# Patient Record
Sex: Female | Born: 2000 | Hispanic: Yes | Marital: Single | Smoking: Never smoker
Health system: Southern US, Community
[De-identification: ages and names within clinical notes are randomized; demographics above are authoritative.]

## PROBLEM LIST (undated history)

## (undated) DIAGNOSIS — J45909 Unspecified asthma, uncomplicated: Secondary | ICD-10-CM

## (undated) DIAGNOSIS — C73 Malignant neoplasm of thyroid gland: Secondary | ICD-10-CM

## (undated) DIAGNOSIS — J309 Allergic rhinitis, unspecified: Secondary | ICD-10-CM

## (undated) DIAGNOSIS — U071 COVID-19: Secondary | ICD-10-CM

## (undated) DIAGNOSIS — E039 Hypothyroidism, unspecified: Secondary | ICD-10-CM

## (undated) HISTORY — DX: Allergic rhinitis, unspecified: J30.9

## (undated) HISTORY — DX: Malignant neoplasm of thyroid gland: C73

## (undated) HISTORY — DX: COVID-19: U07.1

## (undated) HISTORY — DX: Unspecified asthma, uncomplicated: J45.909

## (undated) HISTORY — PX: THYROIDECTOMY: SHX17

## (undated) HISTORY — DX: Hypothyroidism, unspecified: E03.9

---
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
## 2022-08-26 DIAGNOSIS — E559 Vitamin D deficiency, unspecified: Secondary | ICD-10-CM | POA: Diagnosis not present

## 2022-08-26 DIAGNOSIS — E890 Postprocedural hypothyroidism: Secondary | ICD-10-CM | POA: Diagnosis not present

## 2022-08-26 DIAGNOSIS — C73 Malignant neoplasm of thyroid gland: Secondary | ICD-10-CM | POA: Diagnosis not present

## 2022-08-26 DIAGNOSIS — L2082 Flexural eczema: Secondary | ICD-10-CM | POA: Diagnosis not present

## 2022-08-26 DIAGNOSIS — D509 Iron deficiency anemia, unspecified: Secondary | ICD-10-CM | POA: Diagnosis not present

## 2023-06-11 DIAGNOSIS — E559 Vitamin D deficiency, unspecified: Secondary | ICD-10-CM | POA: Diagnosis not present

## 2023-06-11 DIAGNOSIS — C73 Malignant neoplasm of thyroid gland: Secondary | ICD-10-CM | POA: Diagnosis not present

## 2023-06-11 DIAGNOSIS — E890 Postprocedural hypothyroidism: Secondary | ICD-10-CM | POA: Diagnosis not present

## 2023-06-11 DIAGNOSIS — D509 Iron deficiency anemia, unspecified: Secondary | ICD-10-CM | POA: Diagnosis not present

## 2023-11-19 DIAGNOSIS — E890 Postprocedural hypothyroidism: Secondary | ICD-10-CM | POA: Diagnosis not present

## 2023-11-19 DIAGNOSIS — C73 Malignant neoplasm of thyroid gland: Secondary | ICD-10-CM | POA: Diagnosis not present

## 2023-11-19 DIAGNOSIS — Z9089 Acquired absence of other organs: Secondary | ICD-10-CM | POA: Diagnosis not present

## 2023-11-19 DIAGNOSIS — E559 Vitamin D deficiency, unspecified: Secondary | ICD-10-CM | POA: Diagnosis not present

## 2023-11-19 DIAGNOSIS — L2082 Flexural eczema: Secondary | ICD-10-CM | POA: Diagnosis not present

## 2023-11-19 DIAGNOSIS — D509 Iron deficiency anemia, unspecified: Secondary | ICD-10-CM | POA: Diagnosis not present

## 2024-01-15 ENCOUNTER — Encounter: Payer: Self-pay | Admitting: *Deleted

## 2024-05-26 ENCOUNTER — Ambulatory Visit: Admitting: Internal Medicine

## 2024-05-26 ENCOUNTER — Encounter: Payer: Self-pay | Admitting: Internal Medicine

## 2024-05-26 VITALS — BP 120/70 | HR 83 | Ht 63.0 in | Wt 123.0 lb

## 2024-05-26 DIAGNOSIS — E890 Postprocedural hypothyroidism: Secondary | ICD-10-CM | POA: Diagnosis not present

## 2024-05-26 DIAGNOSIS — E559 Vitamin D deficiency, unspecified: Secondary | ICD-10-CM

## 2024-05-26 DIAGNOSIS — C73 Malignant neoplasm of thyroid gland: Secondary | ICD-10-CM

## 2024-05-26 MED ORDER — LEVOTHYROXINE SODIUM 137 MCG PO TABS: 137.0000 ug | ORAL_TABLET | Freq: Every day | ORAL | 1 refills | Status: AC

## 2024-05-26 NOTE — Progress Notes (Signed)
 Patient ID: Debra Juarez, female   DOB: Jun 13, 2000, 24 y.o.   MRN: [id number]  HPI  Debra Juarez is a 24 y.o.-year-old female, referred by her pediatric endocrinologist, Dr. Leita Mora Page, for management of papillary thyroid  cancer and postsurgical hypothyroidism.   Pt. has been dx with papillary thyroid  cancer in 5133, at 24 years old.   Patient was diagnosed with hypothyroidism at ~24 years old after her mother noticed significant hair loss.  This was attributed to Hashimoto's thyroiditis, as her TPO antibodies were elevated.  Mother also has hypothyroidism.  Afterwards, she was also found to have a thyroid  nodule, which turned out to be papillary thyroid  cancer and she had a left total thyroidectomy.  Reviewed previous investigation: A thyroid  ultrasound (08/05/2010) did not show any nodules. Thyroid  ultrasound (08/15/2014): a left thyroid  nodule, likely a pseudonodule Thyroid  ultrasound (06/14/2015): 8 x 10 x 9 mm nodule in the left lobe, hypoechoic, ill-defined, and unchanged Thyroid  ultrasound (07/15/2016): 1.4 x 1.2 x 0.9 cm nodule, increased in size, hypoechoic, with punctate echogenic foci FNA (08/08/2016): Bethesda category V: Suspicion for malignancy She had lymph node mapping before surgery and this showed mostly benign nodules except for 2 nodules without fatty hilum in the level 2A.  A biopsy was attempted on a 1.3 x 1.5 x 0.7 lymph node, however, this was complicated with a punctured artery and hematoma formation.  The biopsy was inconclusive. Patient was subsequently referred to Dr. Auther and he did not feel that the nodules require further attempts to biopsy since it would have been unusual for the thyroid  cancer to metastasize in the level 2A lymph nodes Total thyroidectomy (10/28/2016): Multifocal PTC: Largest focus 1.5 cm, warthin-like variant, in the background of severe chronic lymphocytic thyroiditis.  There was no extrathyroidal extension, lymphovascular invasion, and margins  were uninvolved: TUMOR    Histologic Type:    Papillary carcinoma, other variant: warthin-like variant     Tumor Size in Centimeters (cm):    Greatest dimension in Centimeters (cm): 1.5 Centimeters (cm)       Additional Dimension in Centimers (cm):    1 Centimeters (cm)       Additional Dimension in Centimeters (cm):    1 Centimeters (cm)     Tumor Site:    Left lobe     Tumor Focality:    Multifocal     Tumor Extent:          Extrathyroidal Extension:    Not identified     Accessory Findings:          Angioinvasion (vascular invasion):    Not identified       Lymphatic Invasion:    Not identified       Mitotic Rate:    0 Mitoses per 2 mm^2       Perineural Invasion:    Not identified  MARGINS    Margins:    Uninvolved by carcinoma       Distance of Invasive Carcinoma from Closest Margin in Millimeters (mm):    <0.5 Millimeters (mm)  LYMPH NODES     Number of Lymph Nodes Involved:    0     Number of Lymph Nodes Examined:    7       Nodal Levels:    perithyroidal  PATHOLOGIC STAGE CLASSIFICATION (pTNM, AJCC 8th Edition)     TNM Descriptors:    m (multiple primary tumors)      Primary Tumor (pT):    pT1b  Regional Lymph Nodes (pN):    pN0a  ADDITIONAL FINDINGS   Additional Pathologic Findings:    Thyroiditis: Severe chronic lymphocytic thyroiditis with germinal center formation   She did not have RAI treatment because her tumor was considered low risk. She then had multiple ultrasounds to follow-up 2 small nodules observed in the thyroid  fossa:  Thyroid  ultrasound (05/19/2024): RIGHT NECK:  Status post thyroidectomy. Right thyroid  bed nodule measuring 0.7 x 0.5 cm  x 0.4 cm, previously 0.9 x 0.5 cm x 0.5 predominantly hypoechoic stable in  appearance.  LEFT NECK:  Status post thyroidectomy. Left thyroid  bed nodule measuring 0.4 x 0.3 x  0.2, previously 0.5 x 0.6 x 0.3 cm and stable in appearance.  Lymph Nodes:  No abnormal appearing or enlarged cervical lymph nodes are  identified.   Impression:  Stable bilateral paratracheal structures in the thyroidectomy bed without  abnormal lymph nodes.   I reviewed patient's thyroglobulin and ATA levels:      Pt denies: - feeling nodules in neck - hoarseness - dysphagia - choking  Postsurgical hypothyroidism:  She takes levothyroxine  137 mcg daily (decreased 05/19/2024): - missed 1 week of LT4! 2/2 weather - in am - fasting - at least 30 min from b'fast - no calcium - + iron in the pm - + multivitamins at night - no PPIs - not on Biotin  I reviewed pt's thyroid  tests:  Previously: Lab Results  Component Value Date   TSH 5.62 (H) 06/24/2016   TSH 17.79 (H) 02/18/2016   TSH 3.29 10/17/2015   TSH 0.07 (L) 06/11/2015   TSH 7.777 (H) 03/05/2015   TSH 5.487 (H) 01/15/2015   TSH 0.924 11/30/2014   TSH 5.815 (H) 08/08/2014   TSH 3.254 04/04/2014   TSH 3.087 09/30/2013   FREET4 1.0 06/24/2016   FREET4 0.8 02/18/2016   FREET4 1.1 10/17/2015   FREET4 1.1 06/11/2015   FREET4 0.87 03/05/2015   FREET4 0.91 11/30/2014   FREET4 1.07 08/08/2014   FREET4 1.32 04/04/2014   FREET4 1.12 09/30/2013   FREET4 0.87 08/12/2013    Initial thyroid  antibody levels: Lab Results  Component Value Date   THGAB 1 04/04/2014  04/04/2014: TPO antibody 407  Pt feels well on the above dose of levothyroxine  and does not significantly feel different after running out of the medication.  She has + FH of thyroid  disorders in: Mother-hypothyroidism. No FH of thyroid  cancer.  No h/o radiation tx to head or neck. No seaweed or kelp. No recent contrast studies. No herbal supplements. No Biotin use. No recent steroids use.   Vitamin D  deficiency: Vitamin D  levels remain low:   She is supposed to be taking 2000 units vitamin D  daily, but not taking.  ROS: Constitutional: no weight gain/loss, no fatigue, no subjective hyperthermia/hypothermia Eyes: no blurry vision, no xerophthalmia ENT: no sore throat, no nodules  felt in neck, no dysphagia/odynophagia, no hoarseness Cardiovascular: no CP/SOB/palpitations/leg swelling Respiratory: no cough/SOB Gastrointestinal: no N/V/D/C Musculoskeletal: no muscle/joint aches Skin: no rashes Neurological: no tremors/numbness/tingling/dizziness Psychiatric: no depression/anxiety  Past Medical History:  Diagnosis Date   Allergic rhinitis 10/01/2012   COVID    Papillary thyroid  carcinoma (HCC)    Unspecified asthma(493.90) 10/01/2012   Unspecified hypothyroidism 10/01/2012   Past Surgical History:  Procedure Laterality Date   FOOT SURGERY Right 2019   THYROIDECTOMY     Social History   Socioeconomic History   Marital status: Single    Spouse name: Not on file   Number of  children: Not on file   Years of education: Not on file   Highest education level: Not on file  Occupational History   Not on file  Tobacco Use   Smoking status: Never   Smokeless tobacco: Never  Vaping Use   Vaping status: Never Used  Substance and Sexual Activity   Alcohol use: Not Currently   Drug use: Not Currently   Sexual activity: Not on file  Other Topics Concern   Not on file  Social History Narrative   Lives with both parents   No smokers      Working at [employer]   Social Drivers of Health   Tobacco Use: Low Risk (05/26/2024)   Patient History    Smoking Tobacco Use: Never    Smokeless Tobacco Use: Never    Passive Exposure: Not on file  Financial Resource Strain: Low Risk  (05/18/2024)   Received from [facility]   Overall Financial Resource Strain (CARDIA)    Difficulty of Paying Living Expenses: Not very hard  Food Insecurity: Food Insecurity Present (05/18/2024)   Received from [facility]   Epic    Within the past 12 months, you worried that your food would run out before you got the money to buy more.: Sometimes true    Within the past 12 months, the food you bought just didn't last and you didn't have money to  get more.: Sometimes true  Transportation Needs: No Transportation Needs (05/18/2024)   Received from [facility] - Transportation    In the past 12 months, has lack of transportation kept you from medical appointments or from getting medications?: No    Lack of Transportation (Non-Medical): No  Physical Activity: Not on file  Stress: Not on file  Social Connections: Not on file  Intimate Partner Violence: Not on file  Depression (EYV7-0): Not on file  Alcohol Screen: Not on file  Housing: Unknown (05/18/2024)   Received from [facility]   Epic    In the last 12 months, was there a time when you were not able to pay the mortgage or rent on time?: No    Number of Times Moved in the Last Year: Not on file    At any time in the past 12 months, were you homeless or living in a shelter (including now)?: No  Utilities: Not At Risk (05/18/2024)   Received from [facility]   Epic    In the past 12 months has the electric, gas, oil, or water company threatened to shut off services in your home?: No  Health Literacy: Not on file   Medications Ordered Prior to Encounter[1] Allergies[2] Family History  Problem Relation Age of Onset   Thyroid  disease Mother    Kidney disease Father    Healthy Sister    Healthy Brother    Healthy Maternal Grandmother    Healthy Maternal Grandfather    Diabetes Paternal Grandmother    PE: BP 120/70   Pulse 83   Ht 5' 3 (1.6 m)   Wt 123 lb (55.8 kg)   SpO2 99%   BMI 21.79 kg/m  Wt Readings from Last 3 Encounters:  05/26/24 123 lb (55.8 kg)  07/10/21 122 lb (55.3 kg) (43%, Z= -0.18)*  04/02/21 116 lb 2 oz (52.7 kg) (32%, Z= -0.48)*   * Growth percentiles are based on CDC (Girls, 2-20 Years) data.   Constitutional: normal weight, in  NAD Eyes:  EOMI, no exophthalmos ENT: no neck masses, thyroidectomy scar inconspicuous, without keloid, no cervical lymphadenopathy Cardiovascular: RRR, No  MRG Respiratory: CTA B Musculoskeletal: no deformities Skin:no rashes Neurological: no tremor with outstretched hands  ASSESSMENT: 1.  Papillary thyroid  cancer - no RAI tx - see HPI  2. Postsurgical Hypothyroidism - uncontrolled  3. Vitamin D  deficiency  PLAN:  1.  Whartin-like papillary thyroid  cancer  - Patient with childhood diagnosis of cancer, at 24 years old, in 2018.  At that time, she had a thyroid  nodule for which a biopsy showed suspicion for malignancy.  She had lymph node mapping before surgery which showed several perithyroidal lymph nodes, of which the vast majority contain the fatty hilum, a marker of benignity.  2 nodules, 1 on each side did not have a fatty hilum and an attempt was made to biopsy them.  During the biopsy, an artery was nicked and the patient was transported to ICU after she developed a hematoma.  The biopsy returned inconclusive.  She was referred to Dr. Auther afterwards who did not feel that further attempts to biopsy were necessary due to the decreased likelihood of cancer metastasis in the level 2A lymph nodes.  Patient had total thyroidectomy on 10/28/2016 which showed multifocal papillary thyroid  cancer, Whartin like variant, with the largest focus being 1.5 cm.  This was embedded in a background of significant chronic lymphocytic thyroiditis, consistent with her diagnosis of Hashimoto's thyroiditis.  The cancer was resected in toto, it did not show extrathyroidal extension or lymphovascular invasion.  She was considered low risk and no RAI treatment was recommended.  She had multiple neck ultrasounds afterwards, which showed 2 small nodules on each side of the thyroid  fossa, which involved on the latest ultrasound. -We reviewed together her thyroglobulin levels which have been undetectable since 2021.  Her ATA antibodies are all undetectable now, after having had 1 instance of positive antibodies in 10/2023, of unknown significance.  We discussed that we  need to keep a close eye on her thyroglobulin and the antibodies and have a lower threshold of a whole-body scan and possibly RAI treatment, now that she is out of adolescence. - We also discussed about the good prognosis of papillary thyroid  cancer, and the fact that the warthin-like cancer appears to be more associated with Hashimoto's thyroiditis and have a slightly better prognosis than the conventional papillary thyroid  cancer.  Due to the size of the tumor, age, lack of extrathyroidal extension and lymphovascular invasion, she was considered to have low risk cancer, which was confirmed by restaging subsequently. I reassured her that papillary thyroid  cancer is a slow growing cancer with good prognosis; her life expectancy or quality of life is unlikely to be reduced due to the cancer.  - We also discussed that if she were to develop cancer recurrence or metastasis, RAI treatment performed later has not been shown to have inferior results compared to performing it immediately after thyroidectomy - At today's visit, about repeating her thyroglobulin and ATA at next visit, since they were just performed earlier this month - We also discussed about giving her a longer time between ultrasounds - plan to repeat another ultrasound 1 year from the previous, especially as thyroid  fossa masses are smaller on the latest ultrasound - I will then see the patient in approximately 6 months  2.  Patient with h/o total thyroidectomy for cancer, now with iatrogenic hypothyroidism, on levothyroxine  therapy.  - she admits that she was not consistent  with levothyroxine  dosing during her childhood and teenager years, but not taking it consistently, however, she ran out of the prescription a week ago and was not able to refill it due to this now!  We discussed about the risks of not taking levothyroxine  for such a long time including the risk of recurrent cancer.  I strongly advised her not to run out anymore.  We also  discussed that if she misses a dose, she can take 2 doses the next day. - latest thyroid  labs reviewed with pt. >> TSH was suppressed at last check earlier this month, at 0.06 - she continues on LT4 137 mcg daily - pt feels good on this dose, without complaints today. - we discussed about taking the thyroid  hormone every day, with water, >30 minutes before breakfast, separated by >4 hours from acid reflux medications, calcium, iron, multivitamins. Pt. is taking it correctly, except for the above - will check thyroid  tests in 1 mo: TSH and fT4 - we discussed that our target is the lower target range, not lower - If labs are abnormal, she will need to return for repeat TFTs in 1.5 months - I will see her back in 6 months, but sooner for labs  3. Vitamin D  deficiency - levels are consistently low as she mentions that she is not taking a vitamin D  supplement, despite advised by previous endocrinologist. - will advise her to start vitamin D  2000 units daily - she agrees to start this - will recheck the level in 1.5 mo  Orders Placed This Encounter  Procedures   TSH   T4, free   VITAMIN D  25 Hydroxy (Vit-D Deficiency, Fractures)   Requested Prescriptions   Signed Prescriptions Disp Refills   levothyroxine  (SYNTHROID ) 137 MCG tablet 45 tablet 1    Sig: Take 1 tablet (137 mcg total) by mouth daily.   - time spent for the visit today: 1 hour, in precharting, post charting, reviewing the [facility] charts including previous labs, imaging evaluations, office visit notes from Drs. Page and Auther, and previous treatments, counseling her about her thyroid  cancer and uncontrolled hypothyroidism (please see the discussed topics above), and developing a plan to investigate and treat them. She had a number of questions which I addressed.    Lela Fendt, MD PhD [facility]      [1]  Current Outpatient Medications on File Prior to Visit  Medication Sig Dispense Refill    albuterol  (VENTOLIN  HFA) 108 (90 Base) MCG/ACT inhaler INHALE (2)PUFFS INTO THE LUNGS EVERY 4-6 HOURS AS NEEDED FOR WHEEZING/COUGHING OR SHORT OF BREATH. 18 g 0   Cholecalciferol (VITAMIN D ) 2000 units tablet Take by mouth.     levothyroxine  (SYNTHROID ) 137 MCG tablet Take 137 mcg by mouth daily.     No current facility-administered medications on file prior to visit.  [2] No Known Allergies

## 2024-05-26 NOTE — Patient Instructions (Addendum)
 Please continue Levothyroxine  137 mcg daily.  Take the thyroid  hormone every day, with water, at least 30 minutes before breakfast, separated by at least 4 hours from: - acid reflux medications - calcium - iron - multivitamins  Start vitamin D  2000 units daily.  Please come back for labs in 5 weeks.  Please return in 6 months.

## 2024-11-21 ENCOUNTER — Ambulatory Visit: Admitting: Internal Medicine
